# Patient Record
Sex: Female | Born: 1975 | Race: White | Hispanic: No | Marital: Married | Smoking: Never smoker
Health system: Southern US, Community
[De-identification: ages and names within clinical notes are randomized; demographics above are authoritative.]

---

## 2002-06-22 ENCOUNTER — Other Ambulatory Visit: Admission: RE | Admit: 2002-06-22 | Discharge: 2002-06-22 | Payer: Self-pay | Admitting: Gynecology

## 2003-09-11 ENCOUNTER — Other Ambulatory Visit: Admission: RE | Admit: 2003-09-11 | Discharge: 2003-09-11 | Payer: Self-pay | Admitting: Gynecology

## 2004-09-11 ENCOUNTER — Other Ambulatory Visit: Admission: RE | Admit: 2004-09-11 | Discharge: 2004-09-11 | Payer: Self-pay | Admitting: Gynecology

## 2005-09-26 ENCOUNTER — Other Ambulatory Visit: Admission: RE | Admit: 2005-09-26 | Discharge: 2005-09-26 | Payer: Self-pay | Admitting: Gynecology

## 2006-04-02 ENCOUNTER — Inpatient Hospital Stay (HOSPITAL_COMMUNITY): Admission: AD | Admit: 2006-04-02 | Discharge: 2006-04-05 | Payer: Self-pay | Admitting: Gynecology

## 2006-04-02 ENCOUNTER — Encounter (INDEPENDENT_AMBULATORY_CARE_PROVIDER_SITE_OTHER): Payer: Self-pay | Admitting: Specialist

## 2006-05-13 ENCOUNTER — Other Ambulatory Visit: Admission: RE | Admit: 2006-05-13 | Discharge: 2006-05-13 | Payer: Self-pay | Admitting: Gynecology

## 2008-07-09 ENCOUNTER — Inpatient Hospital Stay (HOSPITAL_COMMUNITY): Admission: AD | Admit: 2008-07-09 | Discharge: 2008-07-12 | Payer: Self-pay | Admitting: Obstetrics and Gynecology

## 2010-06-19 ENCOUNTER — Institutional Professional Consult (permissible substitution): Payer: Self-pay | Admitting: Cardiovascular Disease

## 2010-07-16 ENCOUNTER — Other Ambulatory Visit: Payer: Self-pay | Admitting: Obstetrics and Gynecology

## 2010-08-22 LAB — CBC
HCT: 29 % — ABNORMAL LOW (ref 36.0–46.0)
HCT: 30.9 % — ABNORMAL LOW (ref 36.0–46.0)
MCHC: 34 g/dL (ref 30.0–36.0)
MCV: 90.6 fL (ref 78.0–100.0)
Platelets: 143 10*3/uL — ABNORMAL LOW (ref 150–400)
Platelets: 144 10*3/uL — ABNORMAL LOW (ref 150–400)
RDW: 15.5 % (ref 11.5–15.5)
WBC: 11.7 10*3/uL — ABNORMAL HIGH (ref 4.0–10.5)

## 2010-08-27 LAB — CBC
Hemoglobin: 13.1 g/dL (ref 12.0–15.0)
Platelets: 182 10*3/uL (ref 150–400)
RDW: 15.3 % (ref 11.5–15.5)

## 2010-08-27 LAB — RPR: RPR Ser Ql: NONREACTIVE

## 2010-09-24 NOTE — Op Note (Signed)
Hannah Ross, Hannah Ross NO.:  [id number]   MEDICAL RECORD NO.:  [id number]          PATIENT TYPE:  INP   LOCATION:  9198                          FACILITY:  [facility]   PHYSICIAN:  Carrington Clamp, M.D. DATE OF BIRTH:  02/15/1976   DATE OF PROCEDURE:  DATE OF DISCHARGE:                               OPERATIVE REPORT   PREOPERATIVE DIAGNOSES:  Labor at term, history of cesarean section,  desires repeat.   POSTOPERATIVE DIAGNOSES:  Labor at term, history of cesarean section,  desires repeat.   PROCEDURE:  Low-transverse cesarean section.   SURGEON:  Carrington Clamp, MD   ASSISTANT:  None.   ANESTHESIA:  Spinal.   FINDINGS:  Female infant.  Apgars 9 and 9.  Weight is 7 pounds 9 ounces.  Normal tubes, ovaries, and uterus seen.   SPECIMENS:  None.   ESTIMATED BLOOD LOSS:  700 mL.   IV FLUIDS:  2000 mL.   URINE OUTPUT:  Not measured secondary to a Foley was not able to be  placed.   COMPLICATIONS:  Unable to place a Foley.   MEDICATIONS:  Ancef and Pitocin.   TECHNIQUE:  After adequate spinal anesthesia was achieved, the patient  had been checked and found to be complete and +2.  The patient was  encouraged to push several times in order to see if we can affect  vaginal birth after cesarean section.  However, fetal heart rate went  down to the 80s at that point in time.  A low vacuum was applied and  pulled with two contractions without any pop-offs without any movement  of the baby down whatsoever.  It was decided then to proceed with a  repeat cesarean section and the patient was prepped and draped in the  usual sterile fashion.  A Foley had been attempted to be placed.  However, because of the lowness of the baby's head, we were unable to  insert the Foley into the bladder.   A Pfannenstiel skin incision was made with the scalpel and carried down  to the fascia with the Bovie cautery.  The fascia was incised in midline  with the scalpel and carried down  in a transverse curvilinear manner  with the Mayo scissors.  The fascia was reflected superiorly inferiorly  from the rectus muscles.  The rectus muscles were split in the midline.  The peritoneum was entered into carefully with sharp dissection with  good visualization of the bladder.  The peritoneum was then stretched  open with good visualization of bowel and the bladder.  The  vesicouterine fascia was tented up and incised in a transverse  curvilinear manner and the bladder was retracted out of the field  dissection.  The Alexis instrument was placed and a 2-cm incision was  made in the upper portion of the lower uterine segment until clear fluid  was noted on entry to the amnion.  The incision was extended manually  transversely and the baby was identified in the vertex presentation and  delivered without complication.  The baby was bulb suctioned.  The cord  was clamped  and cut and baby was handed to awaiting neonatology.   The placenta was delivered manually and the uterus cleared of all  debris.  The uterine incision began by closing what appeared to be the  lower uterine segment to the upper portion of the lower uterine segment  in a running-locked stitch.  However, once the blood was able to be  cleared away and we reinspected the anatomy, found that that had been  the lower portion of the cervix and that the lower portion of the  uterine incision was still more inferior.  Therefore, the stitch was  removed carefully and once anatomy was able to be established with the  bladder out of the field dissection and the lower uterine segment versus  the edge of the cervix, I was able to close the lower uterine segment at  the lower edge of the uterine segment to the upper portion of the  uterine segment with a running-locked stitch of 0 Monocryl without  complication.  This was then imbricated with a second stitch of 0  Vicryl.  Once hemostasis was achieved, irrigation was performed  and the  Jennings instrument was removed.  Peritoneum was then closed with a  running stitch of 2-0 Vicryl, which incorporated the rectus muscle as a  separate layer.  The fascia was then closed with a running stitch of 0  Vicryl.  The subcutaneous tissue was rendered hemostatic with the Bovie  cautery and irrigation and then reapproximated with three interrupted  stitches of 2-0 plain gut.  The skin was closed with staples.   Once all the drapes had been removed, then incision was bandaged.  The  patient was placed in lithotomy position and the Foley catheter  inserted.  Clear urine was noted and a vaginal exam also indicated that  the correct anatomy of the cervix and lower uterine segment had been  achieved.  The patient was then transferred to the recovery room in  stable condition.      Carrington Clamp, M.D.  Electronically Signed     MH/MEDQ  D:  07/09/2008  T:  07/09/2008  Job:  [id number]

## 2010-09-24 NOTE — Discharge Summary (Signed)
Hannah Ross, Hannah Ross                ACCOUNT NO.:  [id number]   MEDICAL RECORD NO.:  [id number]          PATIENT TYPE:  INP   LOCATION:  9126                          [facility]ILITY:  [facility]   PHYSICIAN:  Gerrit Friends. Aldona Bar, M.D.   DATE OF BIRTH:  02-May-1976   DATE OF ADMISSION:  07/09/2008  DATE OF DISCHARGE:  07/12/2008                               DISCHARGE SUMMARY   DISCHARGE DIAGNOSES:  1. Term pregnancy, delivered viable 7-pound and 9-ounce female infant,      Apgars of 9 and 9.  2. Blood type A positive.  3. Previous cesarean section.   PROCEDURES:  Repeat cesarean section.   SUMMARY:  This is a 35 year old gravida 2, para 1, presented at 38 weeks  and 5 days in labor with ruptured membranes.  She arrived early in the  morning on July 09, 2008.  At the time of admission, she was 5 cm  dilated and Dr. Henderson Cloud arrived shortly thereafter, planning on taking  the patient to the operating room.  She was complete with a vertex at  +2.  The attempt was made to carry out VBAC with aid of the vacuum  extractor, but was unsuccessful and therefore the patient was taken to  the operating room for a repeat low transverse cesarean section, which  was carried out without difficulty with delivery of a 7-pound 9-ounce  female infant with good Apgars.   Postpartum course was benign.  Staples were removed on the morning of  March 2, 20103.  Her wound was Steri-Stripped with Benzoin.  On the  morning of July 12, 2008, she was ambulating well, tolerating a regular  diet well, having normal bowel and bladder function.  She was afebrile.  The wound was clean and dry.  Vital signs were stable and she was ready  for discharge.  She was given all appropriate instructions for discharge  brochure and understood all instructions well.  Discharge hemoglobin was  9.9 with a white count of a 8,900 and platelet count of 144,000.  She  was given prescriptions for Motrin 600 mg to use every 6 hours as needed  for  cramping or pain, Tylox 1-2 every 4-6 hours as needed for more  severe pain, and she will continue on her vitamins one a day as long as  she is breast feeding, and in addition use iron daily as she has been  during her antenatal course.   Return to the office will be in  4 weeks' time or as needed.   CONDITION ON DISCHARGE:  Improved .      Gerrit Friends. Aldona Bar, M.D.  Electronically Signed     RMW/MEDQ  D:  07/12/2008  T:  07/12/2008  Job:  [id number]

## 2010-09-27 NOTE — Op Note (Signed)
NAMEARLANDA, Hannah Ross                ACCOUNT NO.:  [id number]   MEDICAL RECORD NO.:  [id number]          PATIENT TYPE:  INP   LOCATION:  9130                          [facility]   PHYSICIAN:  Timothy P. Fontaine, M.D.DATE OF BIRTH:  [birth year]   DATE OF PROCEDURE:  04/02/2006  DATE OF DISCHARGE:                                 OPERATIVE REPORT   PREOPERATIVE DIAGNOSES:  1. Pregnancy at term.  2. Cephalopelvic disproportion.  3. Arrest of descent.  4. Chorioamnionitis suspect.   POSTOPERATIVE DIAGNOSES:  1. Pregnancy at term.  2. Cephalopelvic disproportion.  3. Arrest of descent.  4. Chorioamnionitis suspect.   PROCEDURE:  Primary low transverse cervical cesarean section.   SURGEON:  Colin Broach, M.D.   ASSISTANT:  Scrub technician.   ANESTHESIA:  Epidural.   ESTIMATED BLOOD LOSS:  Less than 500 mL.   COMPLICATIONS:  None.   SPECIMENS:  1. Samples of cord blood.  2. Placenta.   FINDINGS:  Normal female infant, Apgars 9 and 9 at 14:07, weight pending.  Pelvic anatomy noted to be normal.   PROCEDURE:  The patient was taken to the operating room, underwent dosing of  her epidural catheter and was placed in the left tilt supine position and  received an abdominal preparation with Betadine solution.  Foley catheter  was previously placed on labor and delivery.  The patient was draped in the  usual fashion.  After assuring adequate anesthesia the abdomen was sharply  entered through a Pfannenstiel's incision achieving adequate hemostasis at  all levels.  Bladder flap was sharply and bluntly developed without  difficulty.  The uterus was sharply entered in the lower uterine segment  bluntly extended laterally.  Membranes were ruptured.  Fluid noted to be  clear.  The infant's head delivered through the incision.  The nares and  mouth suctioned, the rest of the infant delivered, the cord doubly clamped  and cut and the infant handed to pediatrics in attendance.   Samples of cord  blood were obtained and cord blood banking was not obtained as it was not  arranged previously.  The placenta was extruded spontaneously and noted to  be intact was sent to pathology.  The uterus was exteriorized and the  endometrial cavity explored with a sponge to remove all placental membrane  fragments.  The patient received antibiotic prophylaxis at this time.   The uterine incision was then closed in two layers using a 0 Vicryl suture  first in a running interlocking stitch followed by imbricating stitch.  The  uterus was returned to the abdomen which was copiously irrigated.  Adequate  hemostasis was visualized.  Anterior fascia reapproximated using 0 Vicryl  suture in a running stitch.  Subcutaneous tissues irrigated.  Hemostasis  achieved with electrocautery.  The skin reapproximated using 4-0 Vicryl in a  running subcuticular stitch.  Steri-Strips and Benzoin applied.  Sterile  dressing applied.  The patient taken to the recovery room in good condition  having tolerated the procedure well.      Timothy P. Fontaine, M.D.  Electronically Signed  TPF/MEDQ  D:  04/02/2006  T:  04/02/2006  Job:  [id number]

## 2010-09-27 NOTE — Discharge Summary (Signed)
Hannah Ross, Hannah Ross                ACCOUNT NO.:  [id number]   MEDICAL RECORD NO.:  [id number]          PATIENT TYPE:  INP   LOCATION:  9127                          [facility]   PHYSICIAN:  Juan H. Lily Peer, M.D.DATE OF BIRTH:  06-16-1975   DATE OF ADMISSION:  04/02/2006  DATE OF DISCHARGE:  04/05/2006                                 DISCHARGE SUMMARY   HISTORY OF PRESENT ILLNESS:  The patient is a 35 year old gravida 1, now  para 1, who was 38-6/7 weeks who states that she presented to [facility] [place] with spontaneous rupture of membranes and  contracting. She went on to completely dilate but despite adequate labor  pattern and pushing, did not progress in her second stage of labor and was  taken by Dr. Colin Broach for a primary cesarean section secondary to  suspected cephalopelvic disproportion and chorioamnionitis. The patient  delivered a viable female infant with Apgar's of 9 and 9. The patient's  postoperative course is uneventful. On postoperative day 1, her hemoglobin  was 10.1. She was afebrile. Blood type A positive. She was Rubella immune.  Her Foley catheter was discontinued after 24 hours and her diet was  increased from clear to a regular diet. By the third day, she was up and  ambulating, tolerating a regular diet, and was passing gas and was ready to  be discharged home.   FINAL DIAGNOSES:  1. Term intrauterine pregnancy at 38-6/[redacted] weeks gestation, delivered.  2. Spontaneous rupture of membranes.  3. Labor.  4. Chorioamnionitis.  5. Arrest of second stage of labor.  6. Postpartum anemia.   PROCEDURES:  1. Fetal monitoring.  2. Pitocin augmentation.  3. Primary low uterine segment transverse cesarean section.   DISPOSITION:  The patient was discharged home on her third postoperative  day.   DISCHARGE MEDICATIONS:  1. Tylox 1 p.o. q.4 to 6 hours p.r.n. pain.  2. Continue taking prenatal vitamins and iron.   FOLLOWUP:  In the office in  6 weeks for postpartum visit.      Juan H. Lily Peer, M.D.  Electronically Signed     JHF/MEDQ  D:  04/05/2006  T:  04/05/2006  Job:  [id number]

## 2011-07-03 ENCOUNTER — Encounter: Payer: Self-pay | Admitting: Gastroenterology

## 2011-07-08 ENCOUNTER — Ambulatory Visit: Payer: Self-pay | Admitting: Gastroenterology

## 2011-07-16 ENCOUNTER — Telehealth: Payer: Self-pay | Admitting: Gastroenterology

## 2011-07-16 NOTE — Telephone Encounter (Signed)
Message copied by Arna Snipe on Wed Jul 16, 2011 10:34 AM ------      Message from: Harlow Mares D      Created: Mon Jul 14, 2011  4:23 PM                   ----- Message -----         From: Harlow Mares, CMA         Sent: 07/14/2011   4:14 PM           To: Harlow Mares, CMA            Please bill pt for no show on 07/08/2011

## 2012-08-05 ENCOUNTER — Other Ambulatory Visit: Payer: Self-pay | Admitting: Obstetrics and Gynecology

## 2013-08-19 ENCOUNTER — Encounter: Payer: Self-pay | Admitting: *Deleted

## 2013-08-22 ENCOUNTER — Encounter: Payer: Self-pay | Admitting: Neurology

## 2013-08-22 ENCOUNTER — Encounter (INDEPENDENT_AMBULATORY_CARE_PROVIDER_SITE_OTHER): Payer: Self-pay

## 2013-08-22 ENCOUNTER — Ambulatory Visit (INDEPENDENT_AMBULATORY_CARE_PROVIDER_SITE_OTHER): Payer: Managed Care, Other (non HMO) | Admitting: Neurology

## 2013-08-22 VITALS — BP 130/87 | HR 90 | Ht 59.75 in | Wt 163.0 lb

## 2013-08-22 DIAGNOSIS — R531 Weakness: Secondary | ICD-10-CM

## 2013-08-22 DIAGNOSIS — R5381 Other malaise: Secondary | ICD-10-CM

## 2013-08-22 DIAGNOSIS — R209 Unspecified disturbances of skin sensation: Secondary | ICD-10-CM

## 2013-08-22 DIAGNOSIS — R202 Paresthesia of skin: Secondary | ICD-10-CM | POA: Insufficient documentation

## 2013-08-22 DIAGNOSIS — R5383 Other fatigue: Secondary | ICD-10-CM

## 2013-08-22 NOTE — Progress Notes (Signed)
[facility]    Provider:  Dr Hosie PoissonSumner Referring Provider: Andi Devonloward, Laban Emperoravis L, MD Primary Care Physician:  No primary provider on file.  CC:  Pain and paresthesias  HPI:  Hannah Ross is a 38 y.o. female here as a referral from Dr. Andi Devonloward for pain and paresthesias. She has been evaluated in the past with Dr Marjory LiesPenumalli too. Notes symptoms started around 2011 and have gotten progressively worse. Describes a generalized paresthesias, has aching of her muscles, fatigued all the time. Initially tingling was just in her legs but now involves the whole body, described as an aching burning pain. Notes when it is cold she gets a severe generalized burning sensation. Lately having a severe headache, located in left occipital region, squeezing type sensation. Notes overall cognitive decline, impaired short term memory. Notes some difficulty reading, has trouble processing the words as she reads them. Has trouble getting the words out.   Has been evaluated at [facility] in the past, told symptoms may be related to anxiety and was started on an antidepressant. 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  Head CT reviewed from 3 years back was unremarkable.   Review of Systems: Out of a complete 14 system review, the patient complains of only the following symptoms, and all other reviewed systems are negative. + memory loss , headache, numbness, weakness, aching muscles, decreased energy  History   Social History  . Marital Status: Married    Spouse Name: N/A    Number of Children: N/A  . Years of Education: N/A   Occupational History  . Not on file.   Social History Main Topics  . Smoking status: Never Smoker   . Smokeless tobacco: Never Used  . Alcohol Use: No  . Drug Use: No  . Sexual Activity: Not on file   Other Topics Concern  . Not on file   Social History Narrative   Married, 2 children   Right handed   Bachelor degree   No  caffeine    No family history on file.  No past medical history on file.  No past surgical history on file.  Current Outpatient Prescriptions  Medication Sig Dispense Refill  . Multiple Vitamins-Minerals (MULTIVITAMIN WITH MINERALS) tablet Take 1 tablet by mouth daily.       No current facility-administered medications for this visit.    Allergies as of 08/22/2013  . (No Known Allergies)    Vitals: BP 130/87  Pulse 90  Ht 4' 11.75" (1.518 m)  Wt 163 lb (73.936 kg)  BMI 32.09 kg/m2 Last Weight:  Wt Readings from Last 1 Encounters:  08/22/13 163 lb (73.936 kg)   Last Height:   Ht Readings from Last 1 Encounters:  08/22/13 4' 11.75" (1.518 m)     Physical exam: Exam: Gen: NAD, conversant Eyes: anicteric sclerae, moist conjunctivae HENT: Atraumatic, oropharynx clear Neck: Trachea midline; supple,  Lungs: CTA, no wheezing, rales, rhonic                          CV: RRR, no MRG Abdomen: Soft, non-tender;  Extremities: No peripheral edema  Skin: Normal temperature, no rash,  Psych: Appropriate affect, pleasant  Neuro: MS: AA&Ox3, appropriately interactive, normal affect   Speech: fluent w/o paraphasic error  Memory: good recent and remote recall  CN: PERRL, EOMI no nystagmus, no ptosis, sensation intact to LT V1-V3 bilat, face symmetric, no weakness, hearing grossly  intact, palate elevates symmetrically, shoulder shrug 5/5 bilat,  tongue protrudes midline, no fasiculations noted.  Motor: normal bulk and tone Strength: 5/5  In all extremities  Coord: rapid alternating and point-to-point (FNF, HTS) movements intact.  Reflexes: symmetrical, bilat downgoing toes  Sens: LT intact in all extremities  Gait: posture, stance, stride and arm-swing normal. Tandem gait intact. Able to walk on heels and toes. Romberg absent.   Assessment:  After physical and neurologic examination, review of laboratory studies, imaging, neurophysiology testing and pre-existing  records, assessment will be reviewed on the problem list.  Plan:  Treatment plan and additional workup will be reviewed under Problem List.  1)Weakness 2)Paresthesias 3)Fatigue  37y/o woman presenting for initial evaluation of generalized painful paresthesias, cognitive decline and fatigue. Unclear etiology, she has been evaluated by 2 neurologists in the past with no clear diagnosis. With age would need to consider MS but her symptoms and history are not fully consistent with this diagnosis. Will consider a rheum/autoimmune disorder. Suspect that stress/anxiety also plays a role in her symptoms. Patient expresses concern over possible diagnosis of fibromyalgia. Will check lab workup, consider rheum follow up depending on lab results. Discussed MRI brain to check for MS, patient wishes to hold off at this time. Follow up with patient once lab results are returned.   Elspeth ChoPeter Sahily Biddle, DO  [facility] [address] [place], [place] [id number]  Phone [phone] Fax [phone]

## 2013-08-22 NOTE — Patient Instructions (Signed)
Overall you are doing fairly well but I do want to suggest a few things today:   Remember to drink plenty of fluid, eat healthy meals and do not skip any meals. Try to eat protein with a every meal and eat a healthy snack such as fruit or nuts in between meals. Try to keep a regular sleep-wake schedule and try to exercise daily, particularly in the form of walking, 20-30 minutes a day, if you can.   As far as diagnostic testing:  1)I would like you to have some blood work completed today 2)We can consider a MRI or referral to rheumatology in the future  Follow up as needed. Please call us with any interim questions, concerns, problems, updates or refill requests.   Please also call us for any test results so we can go over those with you on the phone.  My clinical assistant and will answer any of your questions and relay your messages to me and also relay most of my messages to you.   Our phone number is [phone]. We also have an after hours call service for urgent matters and there is a physician on-call for urgent questions. For any emergencies you know to call 911 or go to the nearest emergency room

## 2013-08-23 ENCOUNTER — Other Ambulatory Visit: Payer: Self-pay | Admitting: Neurology

## 2013-08-23 DIAGNOSIS — R202 Paresthesia of skin: Secondary | ICD-10-CM

## 2013-08-23 NOTE — Progress Notes (Signed)
Quick Note:  Left voice message of normal labs, patient is being referred to a rheumatologist, and will be contacted to set up this appointment, any questions or concerns call the office back. ______

## 2013-08-24 ENCOUNTER — Telehealth: Payer: Self-pay | Admitting: Neurology

## 2013-08-24 LAB — METHYLMALONIC ACID, SERUM: Methylmalonic Acid: 196 nmol/L (ref 0–378)

## 2013-08-24 LAB — VITAMIN B12: Vitamin B-12: 526 pg/mL (ref 211–946)

## 2013-08-24 LAB — TSH: TSH: 1.07 u[IU]/mL (ref 0.450–4.500)

## 2013-08-24 LAB — ANA W/REFLEX IF POSITIVE: Anti Nuclear Antibody(ANA): NEGATIVE

## 2013-08-24 NOTE — Telephone Encounter (Signed)
Recd a call from Dr. Pollyann SavoyShaili Deveshwar stating she does not accepting pt's for Fibromyalgia. Thanks

## 2013-08-25 NOTE — Telephone Encounter (Signed)
SR resent referral to Dr. Shawnee KnappBeekman's office on 08/25/13. Closing encounter. Thanks

## 2013-10-12 ENCOUNTER — Telehealth: Payer: Self-pay | Admitting: Neurology

## 2013-10-12 NOTE — Telephone Encounter (Signed)
Called pt and left message informing her that the blood work that was drawn did include Thyroid and if she has any other problems, questions or concerns to call the office.

## 2013-10-12 NOTE — Telephone Encounter (Signed)
Patient calling questioning if BW drawn included Thyroid.  Please call and advise.

## 2013-12-15 NOTE — Telephone Encounter (Signed)
Noted  

## 2014-01-13 ENCOUNTER — Telehealth: Payer: Self-pay | Admitting: Neurology

## 2014-01-13 NOTE — Telephone Encounter (Signed)
Patient needs a letter stating she has no limitations or restrictions to perform job duties.  Please fax to [facility] and wellness @ [phone], Attention Judeth Cornfield and her contact # [phone].  Please call anytime and may leave detailed message on voicemail.

## 2014-01-17 ENCOUNTER — Encounter: Payer: Self-pay | Admitting: Neurology

## 2014-01-17 NOTE — Telephone Encounter (Signed)
Letter has been faxed.

## 2014-01-17 NOTE — Telephone Encounter (Signed)
This is from Friday, please advise.  

## 2015-10-29 ENCOUNTER — Other Ambulatory Visit: Payer: Self-pay | Admitting: Obstetrics and Gynecology

## 2015-11-12 ENCOUNTER — Other Ambulatory Visit: Payer: Self-pay | Admitting: Obstetrics and Gynecology

## 2016-01-01 ENCOUNTER — Inpatient Hospital Stay (HOSPITAL_COMMUNITY): Admission: RE | Admit: 2016-01-01 | Payer: Self-pay | Source: Ambulatory Visit | Admitting: Obstetrics and Gynecology

## 2016-01-01 ENCOUNTER — Encounter (HOSPITAL_COMMUNITY): Admission: RE | Payer: Self-pay | Source: Ambulatory Visit

## 2016-01-01 SURGERY — Surgical Case
Anesthesia: Regional

## 2019-08-12 ENCOUNTER — Other Ambulatory Visit: Payer: Self-pay | Admitting: Obstetrics and Gynecology

## 2019-08-12 DIAGNOSIS — Z1231 Encounter for screening mammogram for malignant neoplasm of breast: Secondary | ICD-10-CM

## 2019-09-01 ENCOUNTER — Other Ambulatory Visit: Payer: Self-pay

## 2019-09-01 ENCOUNTER — Ambulatory Visit
Admission: RE | Admit: 2019-09-01 | Discharge: 2019-09-01 | Disposition: A | Discharge status: Home / Self Care | Payer: BC Managed Care – PPO | Source: Ambulatory Visit | Attending: Obstetrics and Gynecology | Admitting: Obstetrics and Gynecology

## 2019-09-01 DIAGNOSIS — Z1231 Encounter for screening mammogram for malignant neoplasm of breast: Secondary | ICD-10-CM

## 2020-11-13 ENCOUNTER — Other Ambulatory Visit: Payer: Self-pay | Admitting: Obstetrics and Gynecology

## 2020-11-13 DIAGNOSIS — Z1231 Encounter for screening mammogram for malignant neoplasm of breast: Secondary | ICD-10-CM

## 2020-11-14 ENCOUNTER — Other Ambulatory Visit: Payer: Self-pay

## 2020-11-14 ENCOUNTER — Ambulatory Visit
Admission: RE | Admit: 2020-11-14 | Discharge: 2020-11-14 | Disposition: A | Discharge status: Home / Self Care | Payer: BC Managed Care – PPO | Source: Ambulatory Visit | Attending: Obstetrics and Gynecology | Admitting: Obstetrics and Gynecology

## 2020-11-14 DIAGNOSIS — Z1231 Encounter for screening mammogram for malignant neoplasm of breast: Secondary | ICD-10-CM

## 2021-10-11 ENCOUNTER — Other Ambulatory Visit: Payer: Self-pay | Admitting: Obstetrics

## 2021-10-11 DIAGNOSIS — Z1231 Encounter for screening mammogram for malignant neoplasm of breast: Secondary | ICD-10-CM

## 2021-11-20 ENCOUNTER — Ambulatory Visit
Admission: RE | Admit: 2021-11-20 | Discharge: 2021-11-20 | Disposition: A | Discharge status: Home / Self Care | Payer: BC Managed Care – PPO | Source: Ambulatory Visit | Attending: Obstetrics | Admitting: Obstetrics

## 2021-11-20 DIAGNOSIS — Z1231 Encounter for screening mammogram for malignant neoplasm of breast: Secondary | ICD-10-CM

## 2022-12-11 ENCOUNTER — Other Ambulatory Visit: Payer: Self-pay | Admitting: Obstetrics

## 2022-12-11 DIAGNOSIS — Z1231 Encounter for screening mammogram for malignant neoplasm of breast: Secondary | ICD-10-CM

## 2022-12-24 ENCOUNTER — Ambulatory Visit
Admission: RE | Admit: 2022-12-24 | Discharge: 2022-12-24 | Disposition: A | Discharge status: Home / Self Care | Payer: BC Managed Care – PPO | Source: Ambulatory Visit | Attending: Obstetrics | Admitting: Obstetrics

## 2022-12-24 DIAGNOSIS — Z1231 Encounter for screening mammogram for malignant neoplasm of breast: Secondary | ICD-10-CM

## 2023-01-13 IMAGING — MG MM DIGITAL SCREENING BILAT W/ TOMO AND CAD
8 series · 8 of 24 positions shown · non-contrast
Comparison: Previous exam(s).

CLINICAL DATA: Screening.

EXAM:
DIGITAL SCREENING BILATERAL MAMMOGRAM WITH TOMOSYNTHESIS AND CAD
TECHNIQUE: Bilateral screening digital craniocaudal and mediolateral oblique
mammograms were obtained. Bilateral screening digital breast
tomosynthesis was performed. The images were evaluated with
computer-aided detection.

[R CC synth-2D]
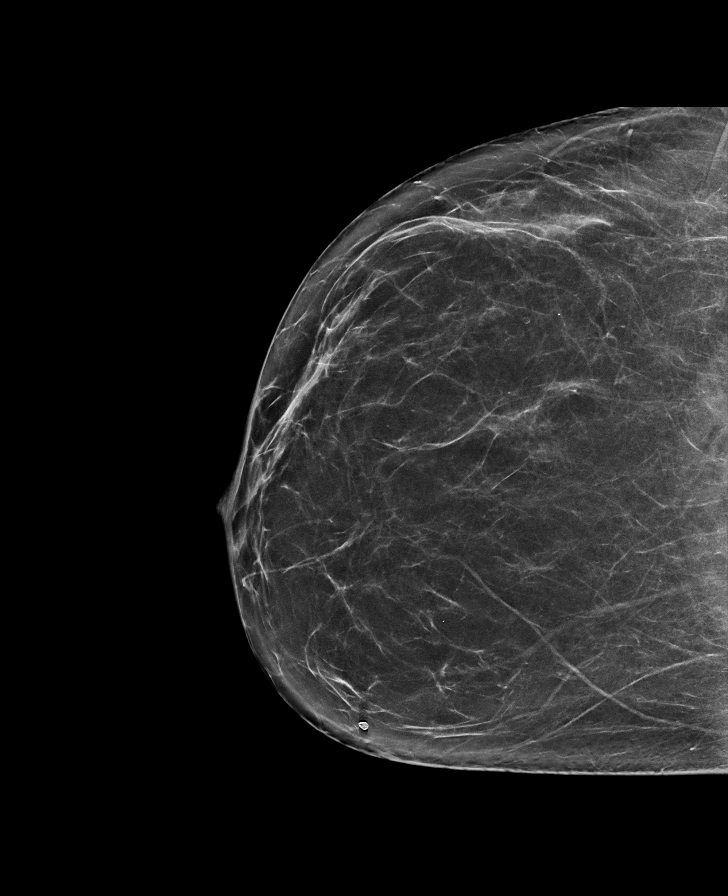

[L MLO synth-2D]
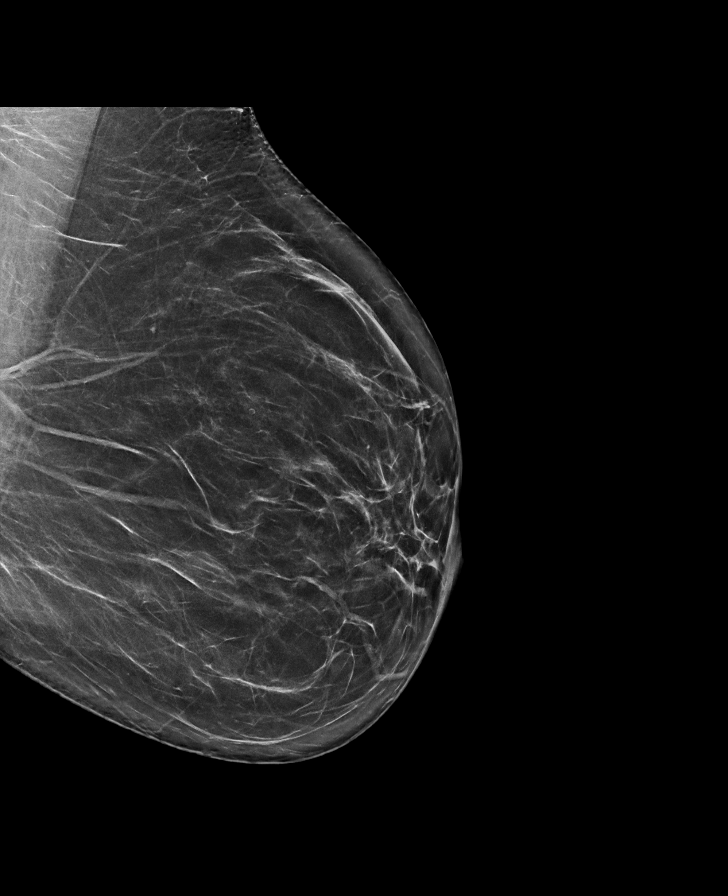

[R MLO synth-2D]
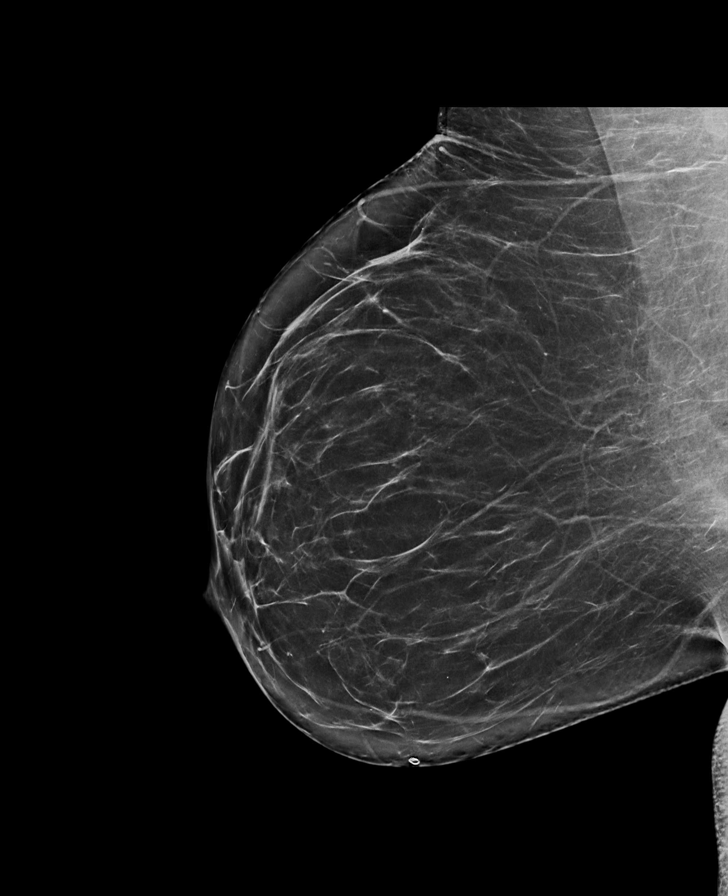

[L CC synth-2D]
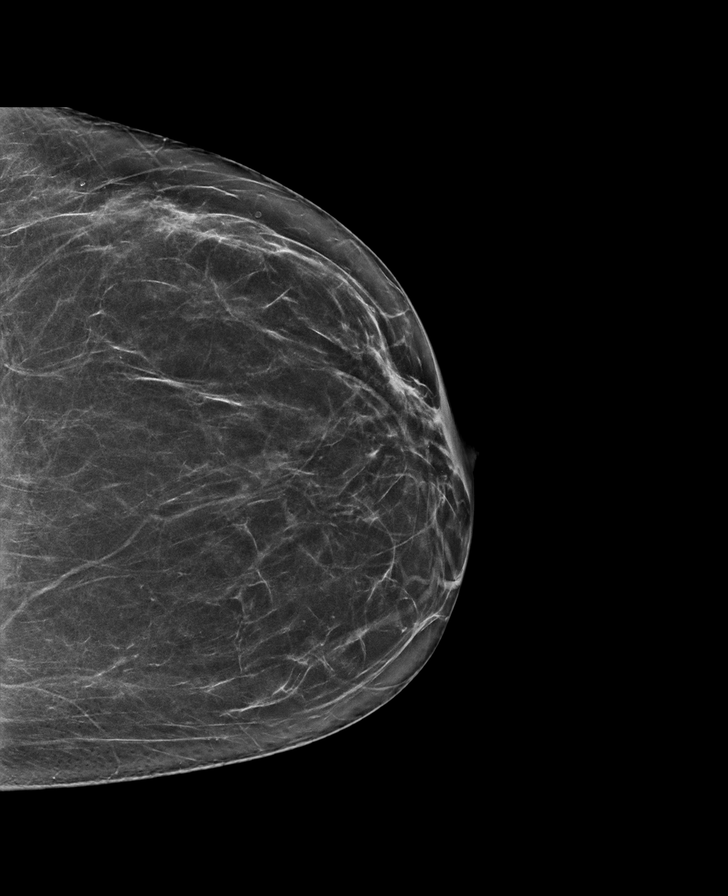

[L CC tomo · tomo slice 34/67.0]
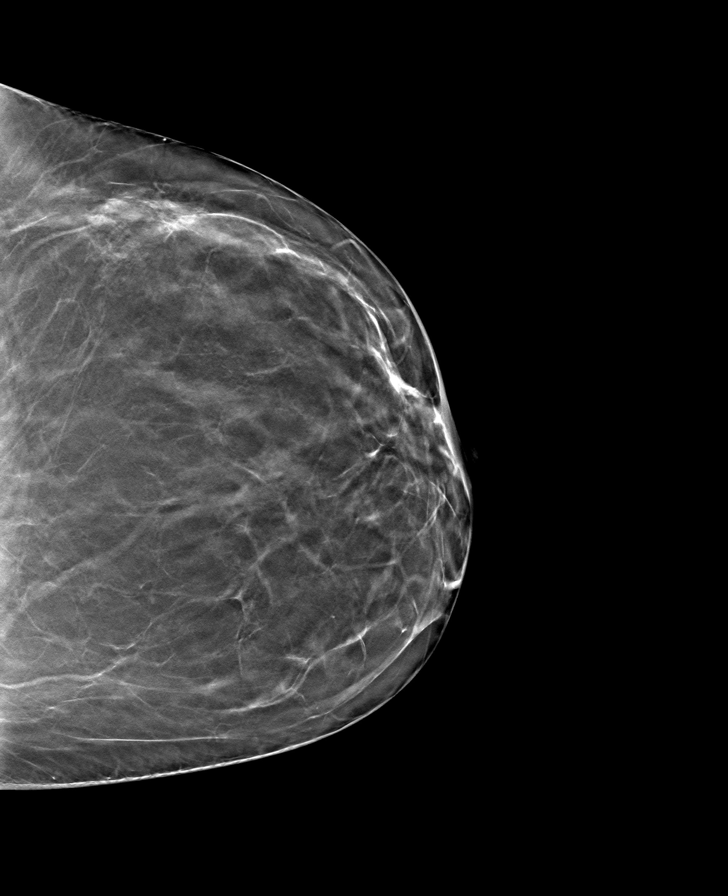

[R CC tomo · tomo slice 35/69.0]
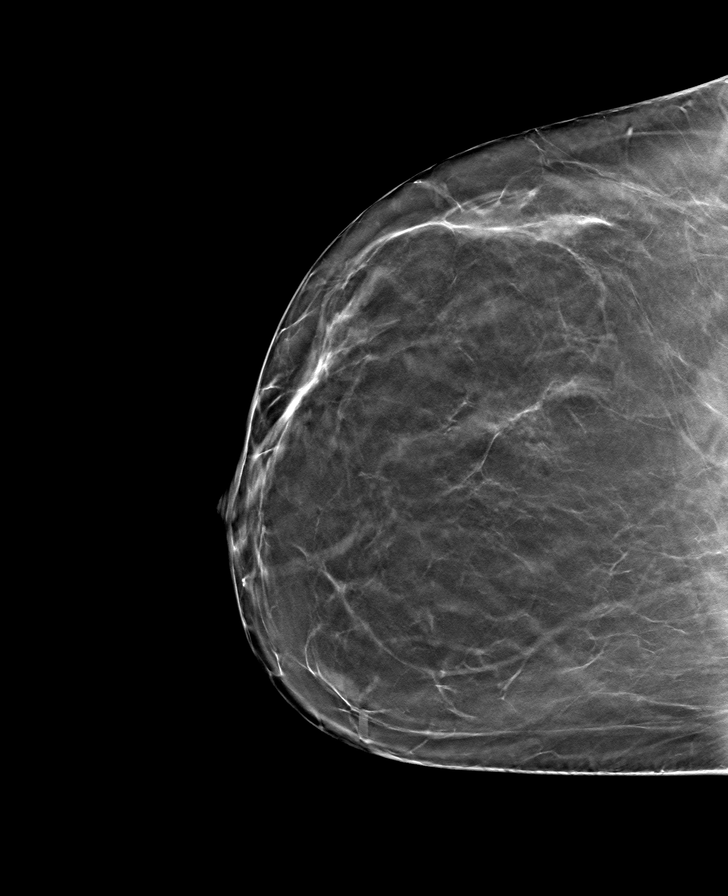

[L MLO tomo · tomo slice 39/76.0]
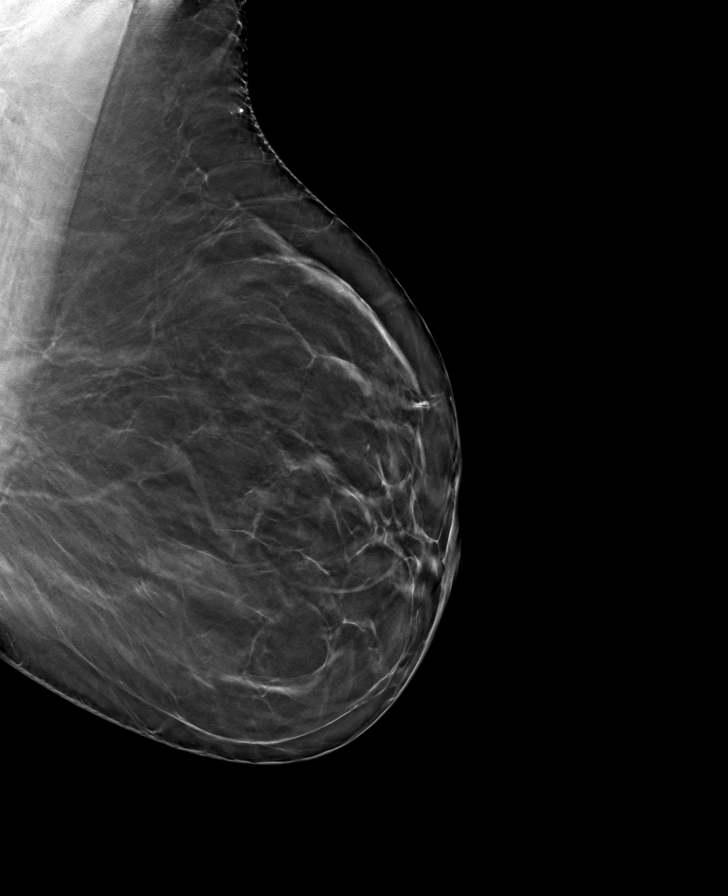

[R MLO tomo · tomo slice 42/83.0]
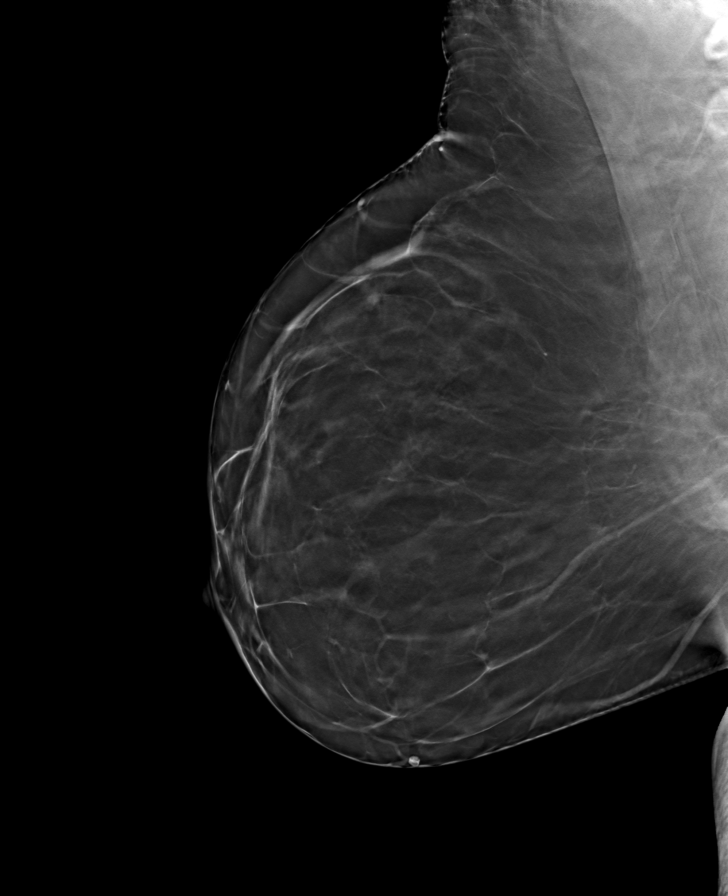

[8 of 24 positions shown; findings below may reference images not displayed]

ACR Breast Density Category b: There are scattered areas of
fibroglandular density.
FINDINGS: There are no findings suspicious for malignancy.
IMPRESSION: No mammographic evidence of malignancy. A result letter of this
screening mammogram will be mailed directly to the patient.

RECOMMENDATION:
Screening mammogram in one year. (Code:51-O-LD2)

BI-RADS CATEGORY  1: Negative.
